# Patient Record
Sex: Male | Born: 1995 | Race: Black or African American | Hispanic: No | Marital: Single | Smoking: Never smoker
Health system: Southern US, Community
[De-identification: ages and names within clinical notes are randomized; demographics above are authoritative.]

## PROBLEM LIST (undated history)

## (undated) ENCOUNTER — Emergency Department (HOSPITAL_COMMUNITY): Payer: Medicaid Other

## (undated) ENCOUNTER — Emergency Department (HOSPITAL_COMMUNITY): Disposition: A | Discharge status: Home / Self Care | Payer: Self-pay

## (undated) ENCOUNTER — Emergency Department (HOSPITAL_COMMUNITY): Admission: EM | Payer: Medicaid Other | Source: Home / Self Care

## (undated) DIAGNOSIS — R41840 Attention and concentration deficit: Secondary | ICD-10-CM

---
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
## 2016-10-06 ENCOUNTER — Emergency Department (HOSPITAL_BASED_OUTPATIENT_CLINIC_OR_DEPARTMENT_OTHER)
Admission: EM | Admit: 2016-10-06 | Discharge: 2016-10-06 | Disposition: A | Discharge status: Home / Self Care | Payer: Medicaid Other | Attending: Emergency Medicine | Admitting: Emergency Medicine

## 2016-10-06 ENCOUNTER — Encounter (HOSPITAL_BASED_OUTPATIENT_CLINIC_OR_DEPARTMENT_OTHER): Payer: Self-pay

## 2016-10-06 DIAGNOSIS — Y939 Activity, unspecified: Secondary | ICD-10-CM | POA: Diagnosis not present

## 2016-10-06 DIAGNOSIS — Y999 Unspecified external cause status: Secondary | ICD-10-CM | POA: Diagnosis not present

## 2016-10-06 DIAGNOSIS — S3992XA Unspecified injury of lower back, initial encounter: Secondary | ICD-10-CM | POA: Diagnosis present

## 2016-10-06 DIAGNOSIS — S39012A Strain of muscle, fascia and tendon of lower back, initial encounter: Secondary | ICD-10-CM | POA: Insufficient documentation

## 2016-10-06 DIAGNOSIS — S5011XA Contusion of right forearm, initial encounter: Secondary | ICD-10-CM | POA: Diagnosis not present

## 2016-10-06 DIAGNOSIS — Z87891 Personal history of nicotine dependence: Secondary | ICD-10-CM | POA: Diagnosis not present

## 2016-10-06 DIAGNOSIS — Y92410 Unspecified street and highway as the place of occurrence of the external cause: Secondary | ICD-10-CM | POA: Insufficient documentation

## 2016-10-06 MED ORDER — CYCLOBENZAPRINE HCL 5 MG PO TABS
5.0000 mg | ORAL_TABLET | Freq: Once | ORAL | Status: DC
Start: 2016-10-06 — End: 2016-10-06

## 2016-10-06 MED ORDER — ACETAMINOPHEN 500 MG PO TABS
1000.0000 mg | ORAL_TABLET | Freq: Once | ORAL | Status: AC
  Administered 2016-10-06: 1000 mg via ORAL
  Filled 2016-10-06: qty 2

## 2016-10-06 MED ORDER — CYCLOBENZAPRINE HCL 10 MG PO TABS: 5.0000 mg | ORAL_TABLET | Freq: Three times a day (TID) | ORAL | 0 refills | Status: AC | PRN

## 2016-10-06 MED FILL — CYCLOBENZAPRINE 10 MG TAB: 10 | 7 days supply | Qty: 10 | Fill #0
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
## 2016-10-06 NOTE — Discharge Instructions (Signed)
Take Tylenol every 4 hours as needed for pain. Take the muscle relaxant prescribed as needed for muscle spasm. Call the numbers on these instructions to get a primary care physician and to be seen if not feeling better in a week. Return if your condition worsens for any reason
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
